# Patient Record
Sex: Female | Born: 1980 | Race: Black or African American | Hispanic: No | Marital: Single | Smoking: Never smoker
Health system: Southern US, Community
[De-identification: ages and names within clinical notes are randomized; demographics above are authoritative.]

## PROBLEM LIST (undated history)

## (undated) DIAGNOSIS — R011 Cardiac murmur, unspecified: Secondary | ICD-10-CM

## (undated) DIAGNOSIS — G43909 Migraine, unspecified, not intractable, without status migrainosus: Secondary | ICD-10-CM

## (undated) DIAGNOSIS — K219 Gastro-esophageal reflux disease without esophagitis: Secondary | ICD-10-CM

---

## 2018-07-14 ENCOUNTER — Emergency Department (HOSPITAL_COMMUNITY): Payer: No Typology Code available for payment source

## 2018-07-14 ENCOUNTER — Emergency Department (HOSPITAL_COMMUNITY)
Admission: EM | Admit: 2018-07-14 | Discharge: 2018-07-14 | Disposition: A | Discharge status: Home / Self Care | Payer: No Typology Code available for payment source | Attending: Emergency Medicine | Admitting: Emergency Medicine

## 2018-07-14 ENCOUNTER — Encounter (HOSPITAL_COMMUNITY): Payer: Self-pay | Admitting: Student

## 2018-07-14 ENCOUNTER — Other Ambulatory Visit: Payer: Self-pay

## 2018-07-14 DIAGNOSIS — R110 Nausea: Secondary | ICD-10-CM | POA: Insufficient documentation

## 2018-07-14 DIAGNOSIS — R0789 Other chest pain: Secondary | ICD-10-CM | POA: Diagnosis not present

## 2018-07-14 DIAGNOSIS — H538 Other visual disturbances: Secondary | ICD-10-CM | POA: Diagnosis not present

## 2018-07-14 DIAGNOSIS — Z6837 Body mass index (BMI) 37.0-37.9, adult: Secondary | ICD-10-CM | POA: Diagnosis not present

## 2018-07-14 DIAGNOSIS — R51 Headache: Secondary | ICD-10-CM | POA: Diagnosis present

## 2018-07-14 DIAGNOSIS — H53149 Visual discomfort, unspecified: Secondary | ICD-10-CM | POA: Diagnosis not present

## 2018-07-14 DIAGNOSIS — R519 Headache, unspecified: Secondary | ICD-10-CM

## 2018-07-14 DIAGNOSIS — E669 Obesity, unspecified: Secondary | ICD-10-CM | POA: Insufficient documentation

## 2018-07-14 HISTORY — DX: Cardiac murmur, unspecified: R01.1

## 2018-07-14 HISTORY — DX: Gastro-esophageal reflux disease without esophagitis: K21.9

## 2018-07-14 HISTORY — DX: Migraine, unspecified, not intractable, without status migrainosus: G43.909

## 2018-07-14 LAB — BASIC METABOLIC PANEL
Anion gap: 10 (ref 5–15)
BUN: 9 mg/dL (ref 6–20)
CO2: 19 mmol/L — ABNORMAL LOW (ref 22–32)
Calcium: 8.8 mg/dL — ABNORMAL LOW (ref 8.9–10.3)
Chloride: 107 mmol/L (ref 98–111)
Creatinine, Ser: 0.98 mg/dL (ref 0.44–1.00)
GFR calc Af Amer: >60 mL/min (ref >60)
GFR calc non Af Amer: >60 mL/min (ref >60)
Glucose, Bld: 101 mg/dL — ABNORMAL HIGH (ref 70–99)
POTASSIUM: 4.4 mmol/L (ref 3.5–5.1)
Sodium: 136 mmol/L (ref 135–145)

## 2018-07-14 LAB — CBC
HEMATOCRIT: 43 % (ref 36.0–46.0)
HEMOGLOBIN: 14.1 g/dL (ref 12.0–15.0)
MCH: 29.9 pg (ref 26.0–34.0)
MCHC: 32.8 g/dL (ref 30.0–36.0)
MCV: 91.1 fL (ref 80.0–100.0)
Platelets: 329 10*3/uL (ref 150–400)
RBC: 4.72 MIL/uL (ref 3.87–5.11)
RDW: 12.7 % (ref 11.5–15.5)
WBC: 7.8 10*3/uL (ref 4.0–10.5)
nRBC: 0 % (ref 0.0–0.2)

## 2018-07-14 LAB — I-STAT BETA HCG BLOOD, ED (MC, WL, AP ONLY): I-stat hCG, quantitative: <5 m[IU]/mL (ref <5)

## 2018-07-14 LAB — I-STAT TROPONIN, ED: Troponin i, poc: 0.01 ng/mL (ref 0.00–0.08)

## 2018-07-14 MED ORDER — DIPHENHYDRAMINE HCL 50 MG/ML IJ SOLN
25.0000 mg | Freq: Once | INTRAMUSCULAR | Status: AC
  Administered 2018-07-14: 25 mg via INTRAVENOUS
  Filled 2018-07-14: qty 1

## 2018-07-14 MED ORDER — SODIUM CHLORIDE 0.9 % IV BOLUS
1000.0000 mL | Freq: Once | INTRAVENOUS | Status: AC
  Administered 2018-07-14: 1000 mL via INTRAVENOUS

## 2018-07-14 MED ORDER — PROCHLORPERAZINE EDISYLATE 10 MG/2ML IJ SOLN
10.0000 mg | Freq: Once | INTRAMUSCULAR | Status: AC
  Administered 2018-07-14: 10 mg via INTRAVENOUS
  Filled 2018-07-14: qty 2

## 2018-07-14 NOTE — ED Notes (Signed)
Patient transported to CT then Xray  

## 2018-07-14 NOTE — ED Provider Notes (Signed)
[facility] EMERGENCY DEPARTMENT Provider Note   CSN: [id number] Arrival date & time: 07/14/18  1805    History   Chief Complaint Chief Complaint  Patient presents with   Headache   Nausea    HPI Olivia Banks is a 38 y.o. female.     Olivia Banks is a 38 y.o. female with a history of migraines, GERD and heart murmur, who presents to the emergency department for evaluation of headache.  Patient reports that for the past week she has had intermittent headaches, headache was initially sudden in onset.  She reports associated intermittent blurred vision, reports her vision is currently normal.  She does wear glasses and reports that her prescription has not been updated in 3 years.  She reports some nausea but no vomiting.  No light sensitivity or sensitivity to sound.  No associated focal numbness or weakness.  No dizziness or room spinning sensation.  No associated fevers or chills, neck pain or stiffness.  No changes in speech.  She reports that she is also intermittently had the sensation of a lump in her throat and today began to have some central chest pressure that has since resolved.  No associated shortness of breath.  Chest pain and pressure not worse with exertion.  No lower extremity welling.  No fever or productive cough.  She has not taken anything to treat her symptoms prior to arrival aside from using some eyedrops to help with blurred vision, she was recommended to her when she went to the [place] regarding these headaches yesterday.  Patient also reports that she recently had a young family member die unexpectedly in the hospital and wonders if stress from this may be contributing to headaches as well.      Past Medical History:  Diagnosis Date   GERD (gastroesophageal reflux disease)    Migraines    Murmur, cardiac     There are no active problems to display for this patient.   History reviewed. No pertinent surgical history.   OB History    No obstetric history on file.      Home Medications    Prior to Admission medications   Not on File    Family History History reviewed. No pertinent family history.  Social History Social History   Tobacco Use   Smoking status: Not on file  Substance Use Topics   Alcohol use: Not on file   Drug use: Not on file     Allergies   Patient has no allergy information on record.   Review of Systems Review of Systems  Constitutional: Negative for chills and fever.  HENT: Negative.   Eyes: Positive for photophobia and visual disturbance. Negative for pain and redness.  Respiratory: Negative for cough and shortness of breath.   Cardiovascular: Positive for chest pain. Negative for palpitations and leg swelling.  Gastrointestinal: Negative for abdominal pain, nausea and vomiting.  Genitourinary: Negative for dysuria and frequency.  Musculoskeletal: Negative for arthralgias, myalgias, neck pain and neck stiffness.  Skin: Negative for color change and rash.  Neurological: Positive for headaches. Negative for dizziness, syncope and light-headedness.     Physical Exam Updated Vital Signs BP 132/85    Pulse 72    Temp 98.4 F (36.9 C) (Oral)    Resp 18    Ht  (1.702 m)    Wt 108.9 kg    LMP 07/04/2018 (Approximate)    SpO2 100%    BMI 37.59 kg/m   Physical  Exam Vitals signs and nursing note reviewed.  Constitutional:      General: She is not in acute distress.    Appearance: She is well-developed. She is obese. She is not ill-appearing or diaphoretic.  HENT:     Head: Normocephalic and atraumatic.  Eyes:     General:        Right eye: No discharge.        Left eye: No discharge.     Extraocular Movements: Extraocular movements intact.     Pupils: Pupils are equal, round, and reactive to light.     Comments: Visual Acuity: Right Eye Near: 10/15 Left Eye Near:  10/12 Bilateral Near: 10/12 PERRLA, EOMs intact bilaterally, visual fields intact.  Neck:      Musculoskeletal: Neck supple. No neck rigidity.     Meningeal: Brudzinski's sign and Kernig's sign absent.  Cardiovascular:     Rate and Rhythm: Normal rate and regular rhythm.     Heart sounds: Normal heart sounds. No murmur. No friction rub. No gallop.   Pulmonary:     Effort: Pulmonary effort is normal. No respiratory distress.     Breath sounds: Normal breath sounds. No wheezing or rales.     Comments: Respirations equal and unlabored, patient able to speak in full sentences, lungs clear to auscultation bilaterally Abdominal:     General: Bowel sounds are normal. There is no distension.     Palpations: Abdomen is soft. There is no mass.     Tenderness: There is no abdominal tenderness. There is no guarding.     Comments: Abdomen soft, nondistended, nontender to palpation in all quadrants without guarding or peritoneal signs  Musculoskeletal:        General: No deformity.  Skin:    General: Skin is warm and dry.     Capillary Refill: Capillary refill takes less than 2 seconds.  Neurological:     Mental Status: She is alert.     GCS: GCS eye subscore is 4. GCS verbal subscore is 5. GCS motor subscore is 6.     Coordination: Coordination normal.     Comments: Speech is clear, able to follow commands CN III-XII intact Normal strength in upper and lower extremities bilaterally including dorsiflexion and plantar flexion, strong and equal grip strength Sensation normal to light and sharp touch Moves extremities without ataxia, coordination intact   Psychiatric:        Mood and Affect: Mood is anxious.        Behavior: Behavior normal.      ED Treatments / Results  Labs (all labs ordered are listed, but only abnormal results are displayed) Labs Reviewed  BASIC METABOLIC PANEL - Abnormal; Notable for the following components:      Result Value   CO2 19 (*)    Glucose, Bld 101 (*)    Calcium 8.8 (*)    All other components within normal limits  CBC  I-STAT TROPONIN, ED    I-STAT BETA HCG BLOOD, ED ([facility], [facility], [facility] ONLY)    EKG EKG Interpretation  Date/Time:  Tuesday July 14 2018 18:16:15 EDT Ventricular Rate:  81 PR Interval:    QRS Duration: 82 QT Interval:  385 QTC Calculation: 447 R Axis:   90 Text Interpretation:  Sinus rhythm Nonspecific T wave abnormality Baseline wander No previous tracing Confirmed by Cathren Laine ([id number]) on 07/15/2018 11:30:07 AM   Radiology Dg Chest 2 View  Result Date: 07/14/2018 CLINICAL DATA:  38 year old female with chest pain  for 3-4 days, nausea. EXAM: CHEST - 2 VIEW COMPARISON:  None. FINDINGS: Low normal lung volumes. Normal cardiac size and mediastinal contours. Visualized tracheal air column is normal aside from mild leftward deviation at the thoracic inlet. Both lungs appear clear. No pneumothorax or pleural effusion. No osseous abnormality identified. Negative visible bowel gas pattern. IMPRESSION: 1. Leftward deviation of the trachea at the thoracic inlet suggestive of right thyroid goiter. 2. Otherwise negative chest. Electronically Signed   By: Odessa Fleming M.D.   On: 07/14/2018 19:43   Ct Head Wo Contrast  Result Date: 07/14/2018 CLINICAL DATA:  38 year old female with headache for 1 week. Worst headache of life. EXAM: CT HEAD WITHOUT CONTRAST TECHNIQUE: Contiguous axial images were obtained from the base of the skull through the vertex without intravenous contrast. COMPARISON:  None. FINDINGS: Brain: Partially empty sella. Normal cerebral volume. No midline shift, ventriculomegaly, mass effect, evidence of mass lesion, intracranial hemorrhage or evidence of cortically based acute infarction. Gray-white matter differentiation is within normal limits throughout the brain. Vascular: No suspicious intracranial vascular hyperdensity. Skull: Negative. Sinuses/Orbits: Visualized paranasal sinuses and mastoids are clear. Other: Negative orbit and scalp soft tissues. IMPRESSION: 1. Partially empty sella, often a normal anatomic  variant but can be associated with idiopathic intracranial hypertension (pseudotumor cerebri). 2. Otherwise normal noncontrast CT appearance of the brain. Electronically Signed   By: Odessa Fleming M.D.   On: 07/14/2018 19:39    Procedures Procedures (including critical care time)  Medications Ordered in ED Medications  sodium chloride 0.9 % bolus 1,000 mL (0 mLs Intravenous Stopped 07/14/18 2155)  prochlorperazine (COMPAZINE) injection 10 mg (10 mg Intravenous Given 07/14/18 1955)  diphenhydrAMINE (BENADRYL) injection 25 mg (25 mg Intravenous Given 07/14/18 1955)     Initial Impression / Assessment and Plan / ED Course  I have reviewed the triage vital signs and the nursing notes.  Pertinent labs & imaging results that were available during my care of the patient were reviewed by me and considered in my medical decision making (see chart for details).  Presents to the emergency department for evaluation of 1 week of intermittent headaches with associated intermittent blurred vision.  She has had some nausea but no active vomiting.  She also reports that today she began feeling some intermittent chest pressure and sensation of a lump in her throat.  No prior cardiac history.  Does have a history of GERD and wonders if this may be contributing has had similar sensation with reflux in the past.  Has a history of migraines but reports this headache feels somewhat different.  She was seen at the [place] and they recommended using eyedrops for blurred vision but this has not helped.  On arrival she has normal vitals and appears calm and in no acute distress.  She has no focal neurologic deficits on exam and normal visual acuity.  Chest pain seems very atypical I have low suspicion for ACS, she has no associated shortness of breath, pain is nonpleuritic, patient is PERC negative.  Will check head CT, basic labs, troponin, EKG and chest x-ray and will treat symptomatically with fluids and migraine cocktail.  EKG  shows normal sinus rhythm, troponin negative, no leukocytosis, normal hemoglobin and no acute electrolyte derangements, and normal renal function.  Chest x-ray shows no active cardiopulmonary disease there is some leftward deviation of the trachea suggestive of a right thyroid goiter.   Head CT does show a partially empty sella which is often a normal anatomic variant but  can also be seen with pseudotumor cerebri, given patient's recurrent headaches with vision changes this is certainly on the differential, patient is also obese putting her at increased risk for this, discussed briefly with Dr. Laurence Slate, who felt that if patient was having vision changes with her headache she should have a lumbar puncture with pressure testing, I discussed this with the patient and given that she is only been having these headaches for 1 week she would prefer to hold off on doing a lumbar puncture, her headache has completely resolved with treatment here in the ED.  She thinks that stress from recent family member death may be contributing.  She will follow-up outpatient with neurology if symptoms continue.  Patient's chest pain work-up has been reassuring and she has had no further chest pain while here in the emergency department.  At this time I feel she is stable for discharge home, return precautions discussed.  Patient expresses understanding and agreement with plan.  Final Clinical Impressions(s) / ED Diagnoses   Final diagnoses:  Bad headache  Atypical chest pain    ED Discharge Orders    None       Legrand Rams 07/16/18 2217    Virgina Norfolk, DO 07/17/18 [phone]

## 2018-07-14 NOTE — ED Notes (Signed)
Pt stated that she is experiencing fear right now due to the Benadryl making her feel sleepy and that falling asleep is frightening because she "feels like she's dying" and that it's something she's "going through" right now.

## 2018-07-14 NOTE — ED Notes (Signed)
ED Provider at bedside. 

## 2018-07-14 NOTE — ED Notes (Signed)
Pt states she might be a stressed due to the loss of a family member that was 7 months pregnant this past weekend.

## 2018-07-14 NOTE — Discharge Instructions (Signed)
I am reassured that your headache has improved and you are no longer experiencing any chest discomfort.  Your work-up today was reassuring.  As we discussed her headaches could be related to stress, but could also be related to something called idiopathic intracranial hypertension.  In order to diagnose this a lumbar puncture is performed to measure the pressure in your spinal fluid.  I think it is reasonable given that your vision is normal today and your headache has improved to monitor these headaches, if they persist I would like for you to follow-up with neurology for potential outpatient lumbar puncture.  Return to the emergency department if you develop significantly worsened headache, persistent blurred vision, persistent vomiting, worsening chest pain, shortness of breath or any other new or concerning symptoms.

## 2018-07-14 NOTE — ED Triage Notes (Signed)
Pt here with c/o headache (that feels different from her Migraines) x1 week.  Pt also feels nauseated but denies any active vomiting.   Pt also endorses CP that began today and the feeling of a lump in her throat.

## 2020-08-27 IMAGING — CT CT HEAD WITHOUT CONTRAST
4 series · 16 of 47 positions shown, 18 images · non-contrast
Comparison: None.

CLINICAL DATA: 37-year-old female with headache for 1 week. Worst
headache of life.

EXAM:
CT HEAD WITHOUT CONTRAST
TECHNIQUE: Contiguous axial images were obtained from the base of the skull
through the vertex without intravenous contrast.

[Series 3: head without · axial · non-contrast · 0.42mm/px · z∈[-124,-4]mm · 7 of 33 slices shown, 9 images]
[im 5/33  brain]
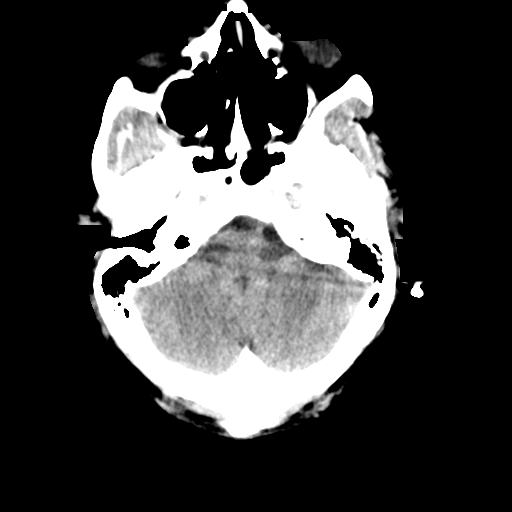
[im 5/33  bone]
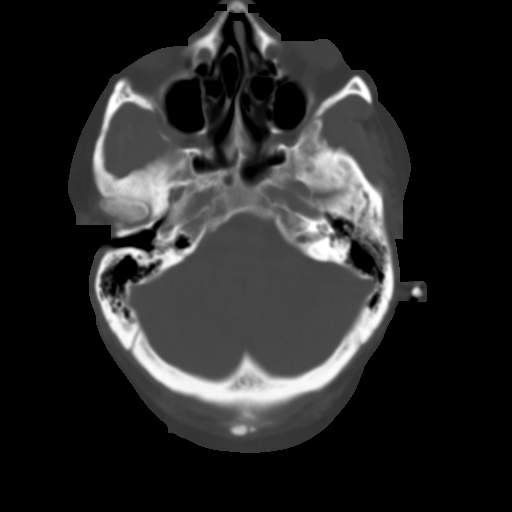
[im 9/33  brain]
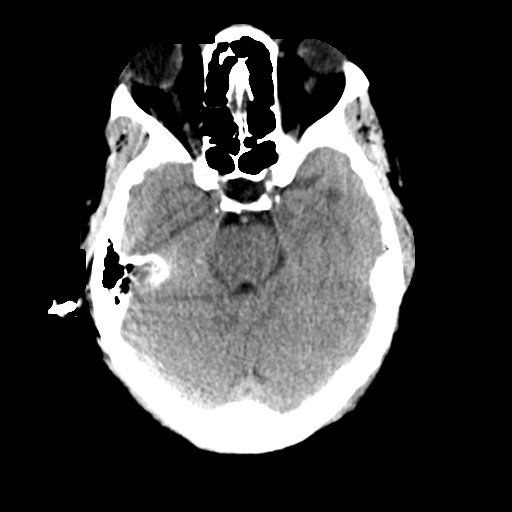
[im 13/33  brain]
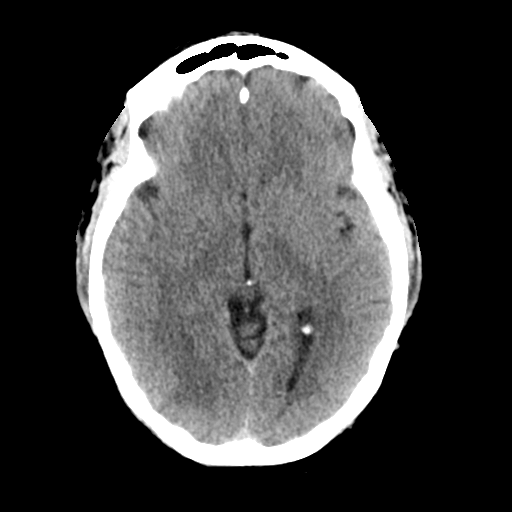
[im 17/33  brain]
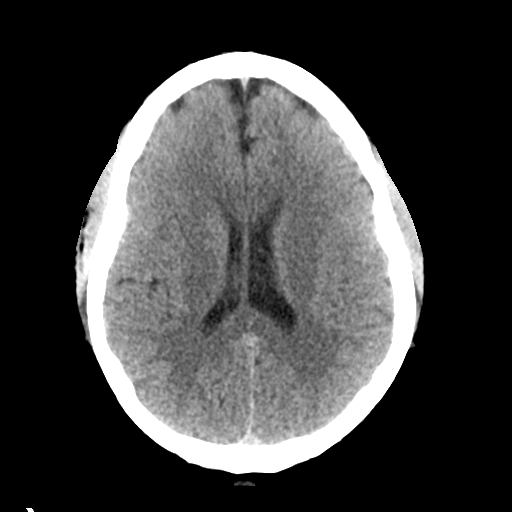
[im 21/33  brain]
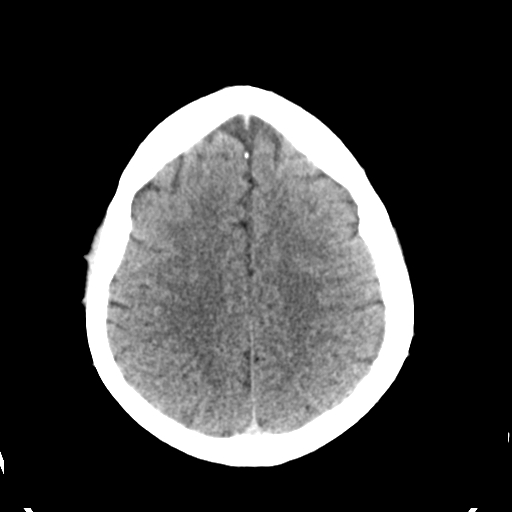
[im 21/33  bone]
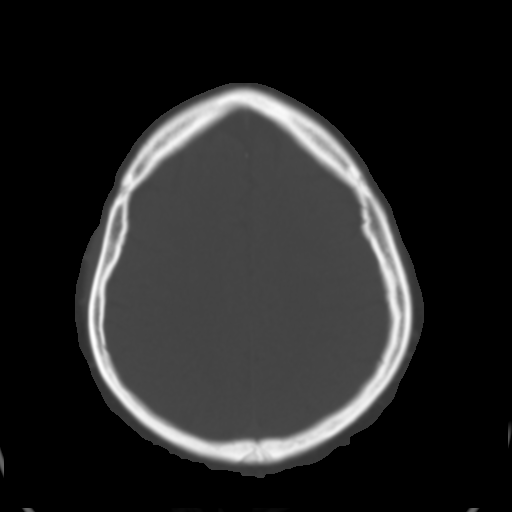
[im 25/33  brain]
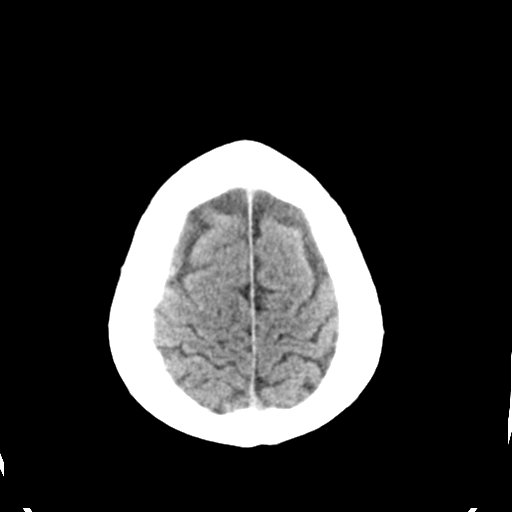
[im 29/33  brain]
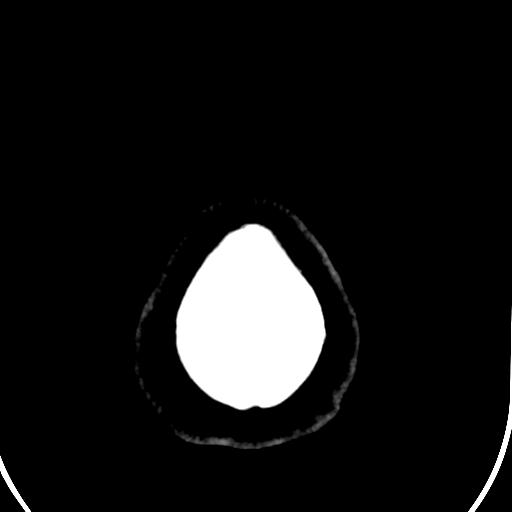

[Series 4: head bone · axial · 0.42mm/px · z∈[-128,-96]mm · 3 of 83 slices shown]
[im 9/83  bone]
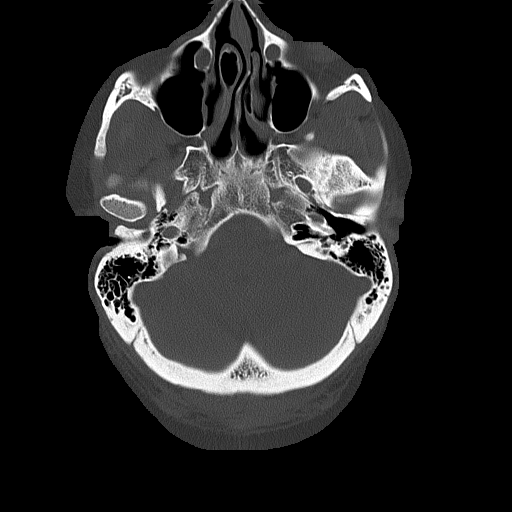
[im 17/83  bone]
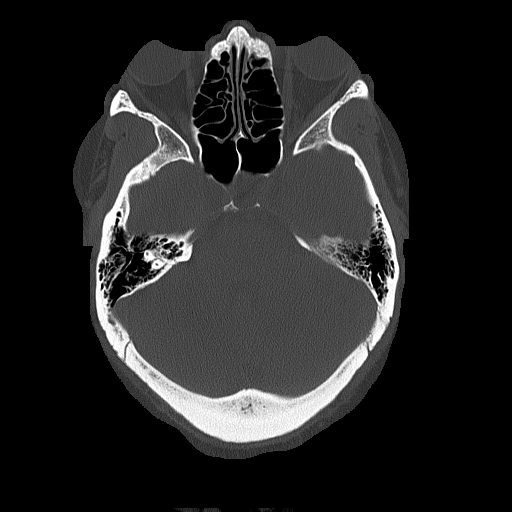
[im 25/83  bone]
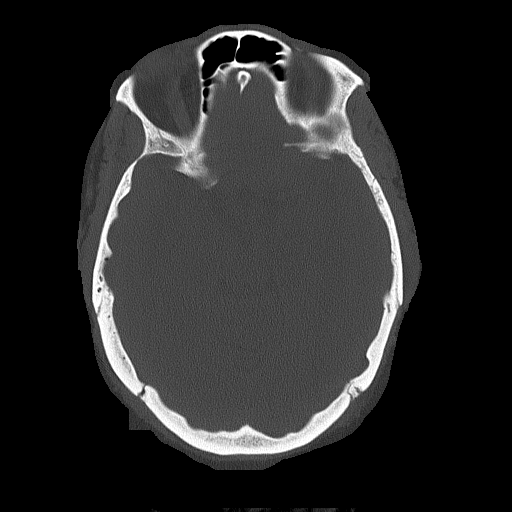

[Series 5: head without cor · coronal · non-contrast · 0.32mm/px · 3 of 67 slices shown]
[im 25/67  brain]
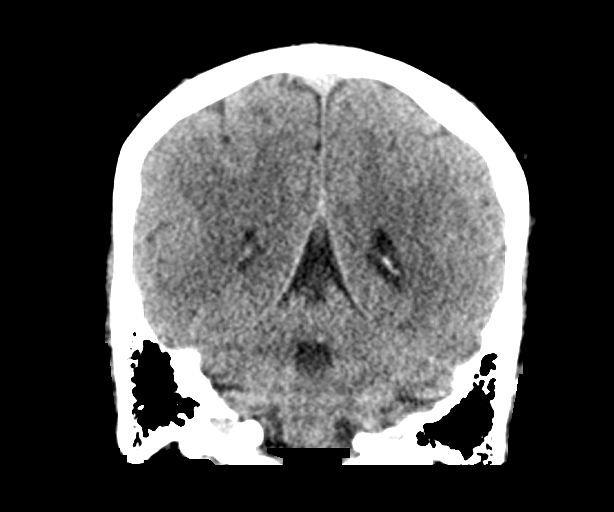
[im 31/67  brain]
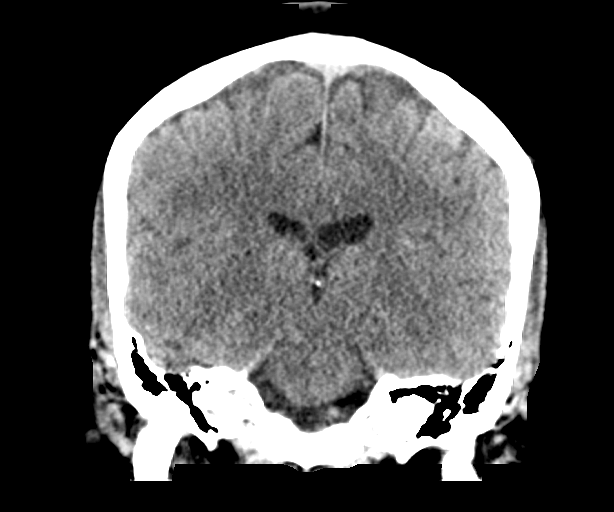
[im 36/67  brain]
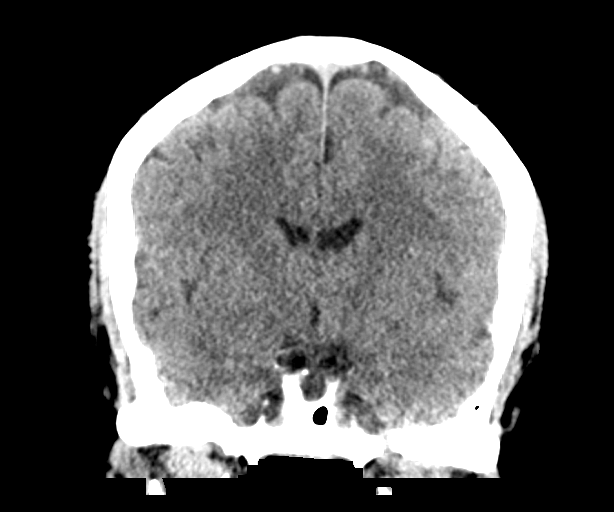

[Series 6: head without sag · sagittal · non-contrast · 0.32mm/px · 3 of 67 slices shown]
[im 23/67  brain]
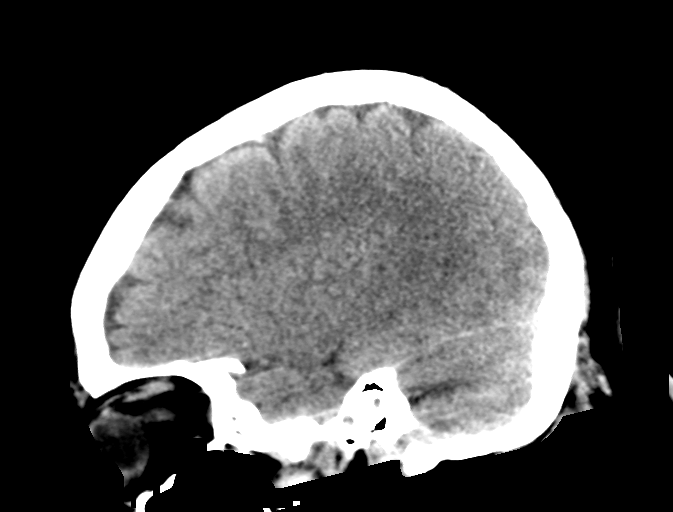
[im 34/67  brain]
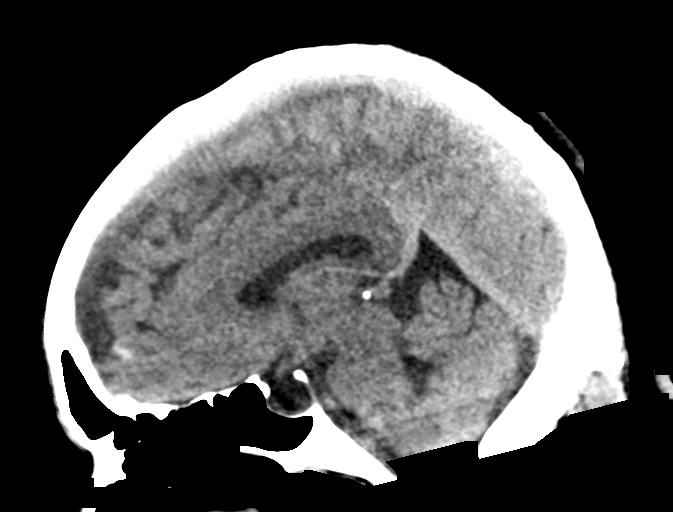
[im 45/67  brain]
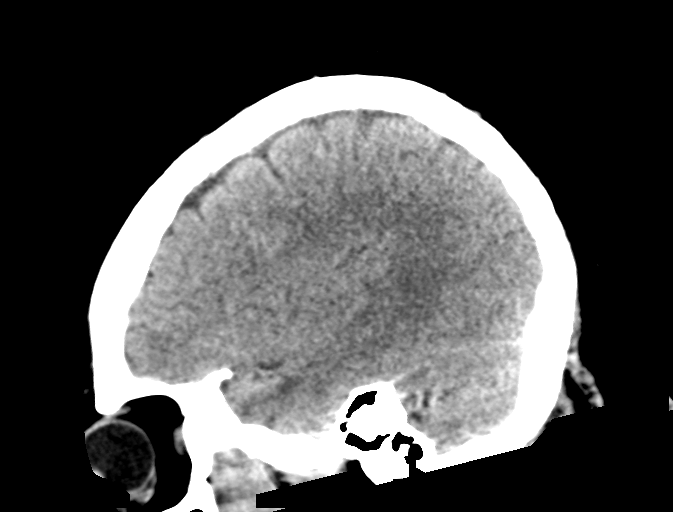

[16 of 47 positions shown; findings below may reference images not displayed]

FINDINGS: Brain: Partially empty sella. Normal cerebral volume. No midline
shift, ventriculomegaly, mass effect, evidence of mass lesion,
intracranial hemorrhage or evidence of cortically based acute
infarction. Gray-white matter differentiation is within normal
limits throughout the brain.

Vascular: No suspicious intracranial vascular hyperdensity.

Skull: Negative.

Sinuses/Orbits: Visualized paranasal sinuses and mastoids are clear.

Other: Negative orbit and scalp soft tissues.
IMPRESSION: 1. Partially empty sella, often a normal anatomic variant but can be
associated with idiopathic intracranial hypertension (pseudotumor
cerebri).
2. Otherwise normal noncontrast CT appearance of the brain.

## 2020-08-27 IMAGING — CR CHEST - 2 VIEW
2 series · 2 of 2 positions shown · non-contrast
Comparison: None.

CLINICAL DATA: 37-year-old female with chest pain for 3-4 days,
nausea.

EXAM:
CHEST - 2 VIEW

[chest pa]
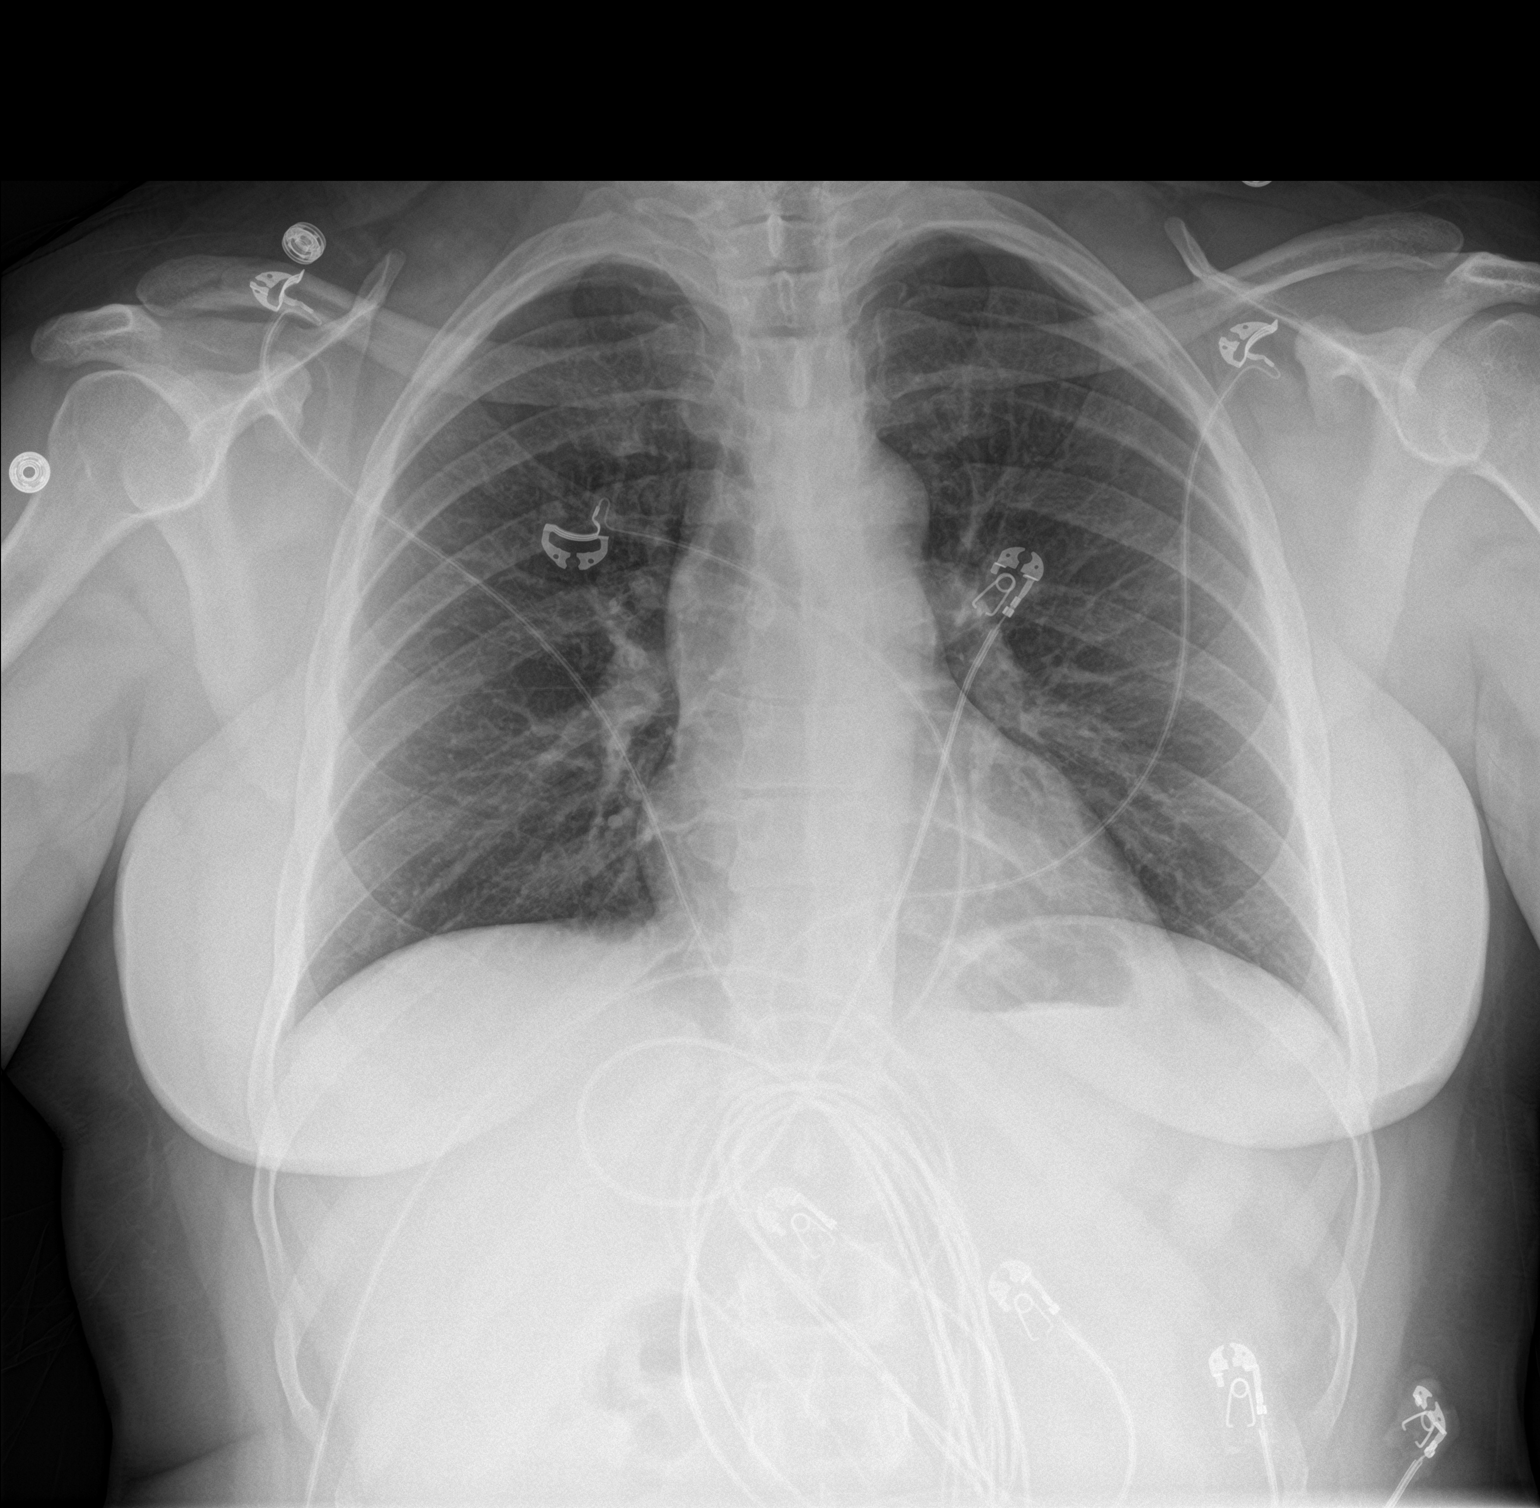

[chest lat]
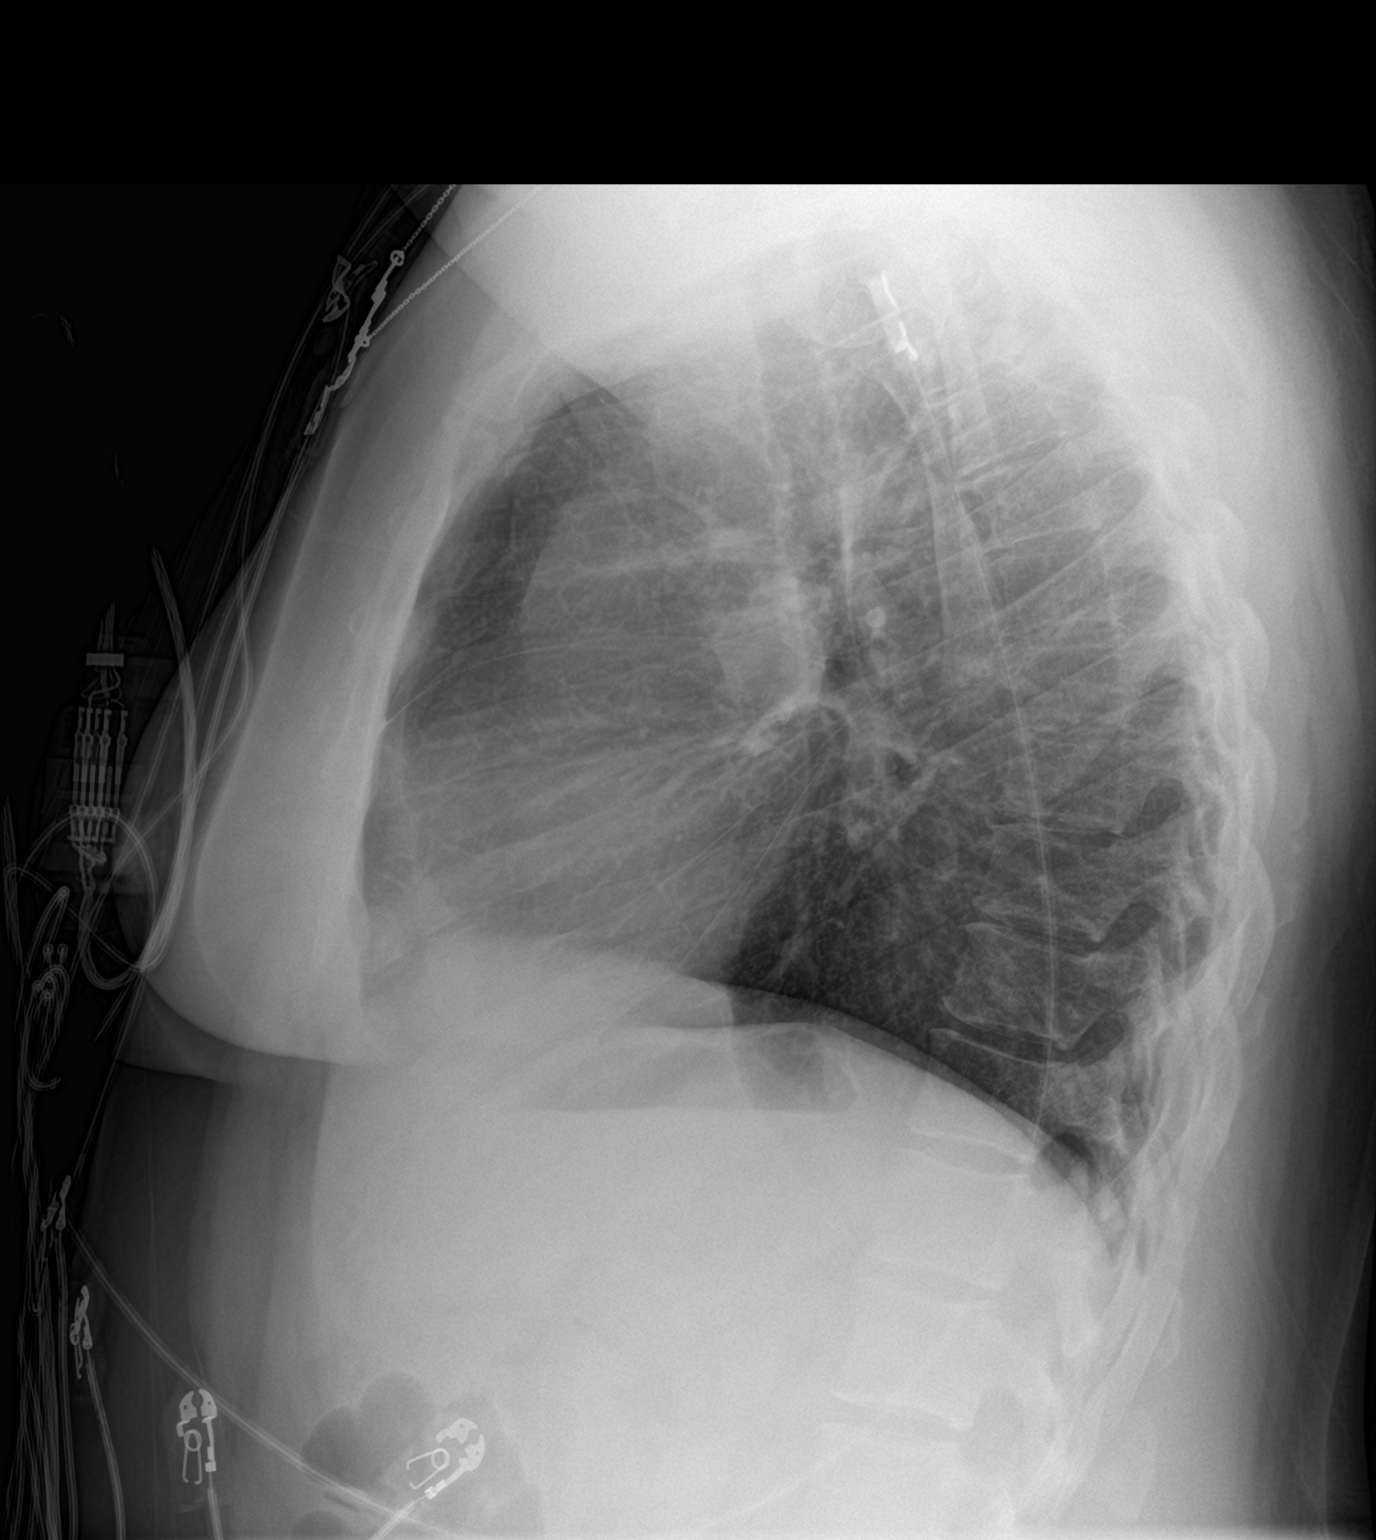

[2 of 2 positions shown; findings below may reference images not displayed]

FINDINGS: Low normal lung volumes. Normal cardiac size and mediastinal
contours. Visualized tracheal air column is normal aside from mild
leftward deviation at the thoracic inlet. Both lungs appear clear.
No pneumothorax or pleural effusion.

No osseous abnormality identified. Negative visible bowel gas
pattern.
IMPRESSION: 1. Leftward deviation of the trachea at the thoracic inlet
suggestive of right thyroid goiter.
2. Otherwise negative chest.

## 2020-10-07 ENCOUNTER — Other Ambulatory Visit: Payer: Self-pay

## 2020-10-07 ENCOUNTER — Encounter: Payer: Self-pay | Admitting: Emergency Medicine

## 2020-10-07 ENCOUNTER — Ambulatory Visit: Admission: EM | Admit: 2020-10-07 | Discharge: 2020-10-07 | Disposition: A | Discharge status: Home / Self Care

## 2020-10-07 DIAGNOSIS — M778 Other enthesopathies, not elsewhere classified: Secondary | ICD-10-CM

## 2020-10-07 MED ORDER — PREDNISONE 10 MG (21) PO TBPK: ORAL_TABLET | Freq: Every day | ORAL | 0 refills | Status: AC

## 2020-10-07 NOTE — Discharge Instructions (Addendum)
Wear wrist splint while at work Once completed steroid dosepak take ibuprofen as needed Begin wrist stretching Rest as much as possible Follow up with orthopedics if no improvement

## 2020-10-07 NOTE — ED Triage Notes (Signed)
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  Patient is having right wrist pain.  Patient has been taking Tylenol 800mg  for discomfort.

## 2020-10-07 NOTE — ED Provider Notes (Signed)
[facility]    CSN: [id number] Arrival date & time: 10/07/20  0839      History   Chief Complaint Chief Complaint  Patient presents with   Wrist Pain    HPI Olivia Banks is a 40 y.o. female.   Pt complains of right wrist pain, denies injury or trauma.  Pain started about two weeks ago. 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 She has been taking tylenol with minimal improvement.  Pt is right handed.    Past Medical History:  Diagnosis Date   GERD (gastroesophageal reflux disease)    Migraines    Murmur, cardiac     There are no problems to display for this patient.   History reviewed. No pertinent surgical history.  OB History   No obstetric history on file.      Home Medications    Prior to Admission medications   Medication Sig Start Date End Date Taking? Authorizing Provider  azelastine (ASTELIN) 0.1 % nasal spray Place 2 sprays into the nose 2 (two) times daily. 09/12/20  Yes [provider]  buPROPion (WELLBUTRIN) 100 MG tablet Take by mouth.   Yes [provider]  cetirizine (ZYRTEC) 10 MG tablet Take 1 tablet by mouth 2 (two) times daily. 09/12/20  Yes [provider]  famotidine (PEPCID) 20 MG tablet Take 1 tablet by mouth 2 (two) times daily. 09/12/20  Yes [provider]  fluticasone (FLONASE) 50 MCG/ACT nasal spray Place into the nose. 09/12/20  Yes [provider]  Magnesium Oxide 420 MG TABS Take 1 tablet by mouth daily. 09/12/20  Yes [provider]  predniSONE (STERAPRED UNI-PAK 21 TAB) 10 MG (21) TBPK tablet Take by mouth daily. Take 6 tabs by mouth daily  for 2 days, then 5 tabs for 2 days, then 4 tabs for 2 days, then 3 tabs for 2 days, 2 tabs for 2 days, then 1 tab by mouth daily for 2 days 10/07/20  Yes Brenson Hartman, Shanda Bumps, PA-C    Family History No family history on file.  Social History Social History   Tobacco Use   Smoking status: Never    Smokeless tobacco: Never     Allergies   Patient has no known allergies.   Review of Systems Review of Systems  Constitutional:  Negative for chills and fever.  HENT:  Negative for ear pain and sore throat.   Eyes:  Negative for pain and visual disturbance.  Respiratory:  Negative for cough and shortness of breath.   Cardiovascular:  Negative for chest pain and palpitations.  Gastrointestinal:  Negative for abdominal pain and vomiting.  Genitourinary:  Negative for dysuria and hematuria.  Musculoskeletal:  Positive for arthralgias (right wrist pain). Negative for back pain.  Skin:  Negative for color change and rash.  Neurological:  Negative for seizures and syncope.  All other systems reviewed and are negative.   Physical Exam Triage Vital Signs ED Triage Vitals [10/07/20 0855]  Enc Vitals Group     BP 114/77     Pulse Rate 62     Resp      Temp 98.1 F (36.7 C)     Temp Source Oral     SpO2 98 %     Weight      Height      Head Circumference      Peak Flow      Pain Score 4     Pain Loc  Pain Edu?      Excl. in GC?    No data found.  Updated Vital Signs BP 114/77 (BP Location: Left Arm)   Pulse 62   Temp 98.1 F (36.7 C) (Oral)   LMP 09/26/2020   SpO2 98%   Visual Acuity Right Eye Distance:   Left Eye Distance:   Bilateral Distance:    Right Eye Near:   Left Eye Near:    Bilateral Near:     Physical Exam Vitals and nursing note reviewed.  Constitutional:      General: She is not in acute distress.    Appearance: She is well-developed.  HENT:     Head: Normocephalic and atraumatic.  Eyes:     Conjunctiva/sclera: Conjunctivae normal.  Cardiovascular:     Rate and Rhythm: Normal rate and regular rhythm.     Heart sounds: No murmur heard. Pulmonary:     Effort: Pulmonary effort is normal. No respiratory distress.     Breath sounds: Normal breath sounds.  Abdominal:     Palpations: Abdomen is soft.     Tenderness: There is no  abdominal tenderness.  Musculoskeletal:     Right wrist: Tenderness (along ecu tendon) present. No swelling, deformity, bony tenderness, snuff box tenderness or crepitus. Normal range of motion. Normal pulse.     Cervical back: Neck supple.  Skin:    General: Skin is warm and dry.  Neurological:     Mental Status: She is alert.     UC Treatments / Results  Labs (all labs ordered are listed, but only abnormal results are displayed) Labs Reviewed - No data to display  EKG   Radiology No results found.  Procedures Procedures (including critical care time)  Medications Ordered in UC Medications - No data to display  Initial Impression / Assessment and Plan / UC Course  I have reviewed the triage vital signs and the nursing notes.  Pertinent labs & imaging results that were available during my care of the patient were reviewed by me and considered in my medical decision making (see chart for details).     Wrist tendonitis due to over use.  Wrist brace given today, advised to wear while at work.  Steroid dosepak prescribed.  She will begin ibuprofen once completed.  Discussed stretching, information given.  Will apply ice to affected area.  If no improvement follow up with orthopedics.  Final Clinical Impressions(s) / UC Diagnoses   Final diagnoses:  Right wrist tendonitis     Discharge Instructions      Wear wrist splint while at work Once completed steroid dosepak take ibuprofen as needed Begin wrist stretching Rest as much as possible Follow up with orthopedics if no improvement   ED Prescriptions     Medication Sig Dispense Auth. Provider   predniSONE (STERAPRED UNI-PAK 21 TAB) 10 MG (21) TBPK tablet Take by mouth daily. Take 6 tabs by mouth daily  for 2 days, then 5 tabs for 2 days, then 4 tabs for 2 days, then 3 tabs for 2 days, 2 tabs for 2 days, then 1 tab by mouth daily for 2 days 42 tablet Alyzae Hawkey, PA-C      PDMP not reviewed this encounter.    Jodell Cipro, PA-C 10/07/20 [phone]

## 2020-12-13 ENCOUNTER — Encounter: Payer: Self-pay | Admitting: Emergency Medicine

## 2020-12-13 ENCOUNTER — Ambulatory Visit
Admission: EM | Admit: 2020-12-13 | Discharge: 2020-12-13 | Disposition: A | Discharge status: Home / Self Care | Payer: BLUE CROSS/BLUE SHIELD | Attending: Urgent Care | Admitting: Urgent Care

## 2020-12-13 ENCOUNTER — Other Ambulatory Visit: Payer: Self-pay

## 2020-12-13 DIAGNOSIS — N760 Acute vaginitis: Secondary | ICD-10-CM

## 2020-12-13 DIAGNOSIS — N898 Other specified noninflammatory disorders of vagina: Secondary | ICD-10-CM | POA: Diagnosis present

## 2020-12-13 MED ORDER — NAPROXEN 375 MG PO TABS: 375.0000 mg | ORAL_TABLET | Freq: Two times a day (BID) | ORAL | 0 refills | Status: AC

## 2020-12-13 MED ORDER — METRONIDAZOLE 500 MG PO TABS: 500.0000 mg | ORAL_TABLET | Freq: Two times a day (BID) | ORAL | 0 refills | Status: AC

## 2020-12-13 MED ORDER — FLUCONAZOLE 150 MG PO TABS: 150.0000 mg | ORAL_TABLET | ORAL | 0 refills | Status: AC

## 2020-12-13 NOTE — ED Provider Notes (Signed)
[facility]   MRN: [id number] DOB: [birth year]  Subjective:   Olivia Banks is a 40 y.o. female presenting for 2 week history of persistent, complete vaginal discharge.  She cannot tell if there is an odor.  She is also had some intermittent lower abdominal/pelvic cramping and pains.  Denies fever, nausea, vomiting, dysuria, hematuria, urinary frequency.  She states that she has had a history of bacterial vaginosis and yeast infections.  States that she has had at least 1 of each yearly.  Denies concern for STIs.  No current facility-administered medications for this encounter.  Current Outpatient Medications:    azelastine (ASTELIN) 0.1 % nasal spray, Place 2 sprays into the nose 2 (two) times daily., Disp: , Rfl:    buPROPion (WELLBUTRIN) 100 MG tablet, Take by mouth., Disp: , Rfl:    cetirizine (ZYRTEC) 10 MG tablet, Take 1 tablet by mouth 2 (two) times daily., Disp: , Rfl:    famotidine (PEPCID) 20 MG tablet, Take 1 tablet by mouth 2 (two) times daily., Disp: , Rfl:    fluticasone (FLONASE) 50 MCG/ACT nasal spray, Place into the nose., Disp: , Rfl:    Magnesium Oxide 420 MG TABS, Take 1 tablet by mouth daily., Disp: , Rfl:    predniSONE (STERAPRED UNI-PAK 21 TAB) 10 MG (21) TBPK tablet, Take by mouth daily. Take 6 tabs by mouth daily  for 2 days, then 5 tabs for 2 days, then 4 tabs for 2 days, then 3 tabs for 2 days, 2 tabs for 2 days, then 1 tab by mouth daily for 2 days, Disp: 42 tablet, Rfl: 0   No Known Allergies  Past Medical History:  Diagnosis Date   GERD (gastroesophageal reflux disease)    Migraines    Murmur, cardiac      History reviewed. No pertinent surgical history.  No family history on file.  Social History   Tobacco Use   Smoking status: Never   Smokeless tobacco: Never    ROS   Objective:   Vitals: BP 135/74 (BP Location: Left Arm)   Pulse (!) 54   Temp 98.5 F (36.9 C) (Oral)   Ht 5\' 7"  (1.702 m)   Wt 195 lb (88.5 kg)   LMP  11/27/2020 (Exact Date)   SpO2 96%   BMI 30.54 kg/m   Physical Exam Constitutional:      General: She is not in acute distress.    Appearance: Normal appearance. She is well-developed. She is not ill-appearing, toxic-appearing or diaphoretic.  HENT:     Head: Normocephalic and atraumatic.     Nose: Nose normal.     Mouth/Throat:     Mouth: Mucous membranes are moist.     Pharynx: Oropharynx is clear.  Eyes:     General: No scleral icterus.       Right eye: No discharge.        Left eye: No discharge.     Extraocular Movements: Extraocular movements intact.     Conjunctiva/sclera: Conjunctivae normal.     Pupils: Pupils are equal, round, and reactive to light.  Cardiovascular:     Rate and Rhythm: Normal rate.  Pulmonary:     Effort: Pulmonary effort is normal.  Abdominal:     General: Bowel sounds are normal. There is no distension.     Palpations: Abdomen is soft. There is no mass.     Tenderness: There is abdominal tenderness (mild, either side of the pelvic region). There is no right CVA tenderness,  left CVA tenderness, guarding or rebound.  Skin:    General: Skin is warm and dry.  Neurological:     General: No focal deficit present.     Mental Status: She is alert and oriented to person, place, and time.  Psychiatric:        Mood and Affect: Mood normal.        Behavior: Behavior normal.        Thought Content: Thought content normal.        Judgment: Judgment normal.    Assessment and Plan :   PDMP not reviewed this encounter.  1. Acute vaginitis   2. Vaginal discharge     Patient declined testing for gonorrhea, syphilis, trichomonas. Testing pending for BV and yeast.  Recommended empiric treatment with Flagyl and Diflucan.  Counseled patient on potential for adverse effects with medications prescribed/recommended today, ER and return-to-clinic precautions discussed, patient verbalized understanding.    Wallis Bamberg, [place] 12/13/20 1949

## 2020-12-13 NOTE — ED Triage Notes (Signed)
Patient c/o possible yeast infection x 2 weeks, white clumpy discharge, vaginal itching, no odor.  No OTC meds.

## 2020-12-15 LAB — CERVICOVAGINAL ANCILLARY ONLY
Bacterial Vaginitis (gardnerella): NEGATIVE
Candida Glabrata: NEGATIVE
Candida Vaginitis: NEGATIVE
Comment: NEGATIVE
Comment: NEGATIVE
Comment: NEGATIVE

## 2023-10-28 ENCOUNTER — Other Ambulatory Visit: Payer: Self-pay | Admitting: Nurse Practitioner

## 2023-10-28 DIAGNOSIS — E042 Nontoxic multinodular goiter: Secondary | ICD-10-CM

## 2023-11-12 ENCOUNTER — Ambulatory Visit
Admission: RE | Admit: 2023-11-12 | Discharge: 2023-11-12 | Disposition: A | Discharge status: Home / Self Care | Source: Ambulatory Visit | Attending: Nurse Practitioner | Admitting: Nurse Practitioner

## 2023-11-12 DIAGNOSIS — E042 Nontoxic multinodular goiter: Secondary | ICD-10-CM
